# Patient Record
Sex: Female | Born: 1995 | Race: Black or African American | Hispanic: No | Marital: Single | Smoking: Never smoker
Health system: Southern US, Community
[De-identification: ages and names within clinical notes are randomized; demographics above are authoritative.]

---

## 2014-07-25 ENCOUNTER — Emergency Department (HOSPITAL_COMMUNITY)
Admission: EM | Admit: 2014-07-25 | Discharge: 2014-07-26 | Disposition: A | Discharge status: Home / Self Care | Payer: BC Managed Care – PPO | Attending: Emergency Medicine | Admitting: Emergency Medicine

## 2014-07-25 ENCOUNTER — Encounter (HOSPITAL_COMMUNITY): Payer: Self-pay | Admitting: Emergency Medicine

## 2014-07-25 DIAGNOSIS — N6489 Other specified disorders of breast: Secondary | ICD-10-CM | POA: Diagnosis present

## 2014-07-25 DIAGNOSIS — L02213 Cutaneous abscess of chest wall: Secondary | ICD-10-CM

## 2014-07-25 DIAGNOSIS — L02219 Cutaneous abscess of trunk, unspecified: Secondary | ICD-10-CM | POA: Insufficient documentation

## 2014-07-25 DIAGNOSIS — L03319 Cellulitis of trunk, unspecified: Principal | ICD-10-CM

## 2014-07-25 NOTE — ED Notes (Signed)
The pt has a red area that is swollen between both her breasts for a few months.  She has squeezed some pus from this area.  No drainage from the nipple  lmp now

## 2014-07-26 MED ORDER — LIDOCAINE-EPINEPHRINE (PF) 2 %-1:200000 IJ SOLN
10.0000 mL | Freq: Once | INTRAMUSCULAR | Status: AC
  Administered 2014-07-26: 10 mL
  Filled 2014-07-26: qty 20

## 2014-07-26 MED ORDER — TRAMADOL HCL 50 MG PO TABS: 50.0000 mg | ORAL_TABLET | Freq: Four times a day (QID) | ORAL | Status: DC | PRN

## 2014-07-26 MED ORDER — NAPROXEN 500 MG PO TABS: 500.0000 mg | ORAL_TABLET | Freq: Two times a day (BID) | ORAL | Status: DC

## 2014-07-26 NOTE — ED Provider Notes (Signed)
Medical screening examination/treatment/procedure(s) were performed by non-physician practitioner and as supervising physician I was immediately available for consultation/collaboration.   EKG Interpretation None        Lareen Mullings M Elwyn Lowden, MD 07/26/14 0417 

## 2014-07-26 NOTE — Discharge Instructions (Signed)

## 2014-07-26 NOTE — ED Provider Notes (Signed)
CSN: [id number]     Arrival date & time 07/25/14  2351 History   First MD Initiated Contact with Patient 07/26/14 0006     Chief Complaint  Patient presents with  . Breast Problem     (Consider location/radiation/quality/duration/timing/severity/associated sxs/prior Treatment) HPI Pt is a 18yo female presenting to ED with c/o gradually worsening red swollen tender skin to middle of her chest.  Pt states she squeezed the area and some pus did come out. Reports having a similar lesion a few months ago but it went away on its own but now came back.  States pain is constant, 8/10 aching and sore, worse with palpation.  Reports taking ibuprofen with minimal relief. Pt does report hx of boils in her underarm areas at times but has never needed them to be I&D.  Denies any other significant PMH.    History reviewed. No pertinent past medical history. History reviewed. No pertinent past surgical history. No family history on file. History  Substance Use Topics  . Smoking status: Never Smoker   . Smokeless tobacco: Not on file  . Alcohol Use: No   OB History   Grav Para Term Preterm Abortions TAB SAB Ect Mult Living                 Review of Systems  Constitutional: Negative for fever and chills.  Gastrointestinal: Negative for nausea and vomiting.  Musculoskeletal: Negative for arthralgias and myalgias.  Skin: Positive for color change and rash. Negative for wound.  All other systems reviewed and are negative.     Allergies  Review of patient's allergies indicates no known allergies.  Home Medications   Prior to Admission medications   Medication Sig Start Date End Date Taking? Authorizing Provider  naproxen (NAPROSYN) 500 MG tablet Take 1 tablet (500 mg total) by mouth 2 (two) times daily. 07/26/14   Junius Finner, PA-C  traMADol (ULTRAM) 50 MG tablet Take 1 tablet (50 mg total) by mouth every 6 (six) hours as needed. 07/26/14   Junius Finner, PA-C   BP 114/67  Pulse 89   Temp(Src) 98.1 F (36.7 C) (Oral)  Resp 20  Ht  (1.448 m)  Wt 122 lb (55.339 kg)  BMI 26.39 kg/m2  SpO2 97%  LMP 07/25/2014 Physical Exam  Nursing note and vitals reviewed. Constitutional: She is oriented to person, place, and time. She appears well-developed and well-nourished.  HENT:  Head: Normocephalic and atraumatic.  Eyes: EOM are normal.  Neck: Normal range of motion.  Cardiovascular: Normal rate.   Pulmonary/Chest: Effort normal.    Musculoskeletal: Normal range of motion.  Neurological: She is alert and oriented to person, place, and time.  Skin: Skin is warm and dry. There is erythema.  3x4cm oval erythematous lesion to medial aspect of left breast near sternum. Tender to palpation, indurated. No active discharge.  C/w superficial skin abscess.  Psychiatric: She has a normal mood and affect. Her behavior is normal.    ED Course  Procedures  INCISION AND DRAINAGE Performed by: Junius Finner A. Consent: Verbal consent obtained. Risks and benefits: risks, benefits and alternatives were discussed Type: abscess  Body area: left side of sternum/left breast  Anesthesia: local infiltration  Incision was made with a scalpel.  Local anesthetic: lidocaine 2% with epinephrine  Anesthetic total: 1 ml  Complexity: simple  Drainage: purulent  Drainage amount: 0.5cc  Packing material: none Patient tolerance: Patient tolerated the procedure well with no immediate complications.     Labs  Review Labs Reviewed - No data to display  Imaging Review No results found.   EKG Interpretation None      MDM   Final diagnoses:  Cutaneous abscess of chest wall   Pt presenting to ED with cutaneous abscess of left breast, medial aspect.  I&D performed w/o immediate complication. Home care instructions provided. Return precautions provided. Pt verbalized understanding and agreement with tx plan.    Junius Finner, PA-C 07/26/14 [phone]

## 2015-07-23 ENCOUNTER — Emergency Department (HOSPITAL_COMMUNITY)
Admission: EM | Admit: 2015-07-23 | Discharge: 2015-07-23 | Disposition: A | Discharge status: Home / Self Care | Payer: BLUE CROSS/BLUE SHIELD | Attending: Emergency Medicine | Admitting: Emergency Medicine

## 2015-07-23 ENCOUNTER — Encounter (HOSPITAL_COMMUNITY): Payer: Self-pay | Admitting: Family Medicine

## 2015-07-23 DIAGNOSIS — S3992XA Unspecified injury of lower back, initial encounter: Secondary | ICD-10-CM | POA: Insufficient documentation

## 2015-07-23 DIAGNOSIS — S199XXA Unspecified injury of neck, initial encounter: Secondary | ICD-10-CM | POA: Diagnosis not present

## 2015-07-23 DIAGNOSIS — Z791 Long term (current) use of non-steroidal anti-inflammatories (NSAID): Secondary | ICD-10-CM | POA: Diagnosis not present

## 2015-07-23 DIAGNOSIS — O9A211 Injury, poisoning and certain other consequences of external causes complicating pregnancy, first trimester: Secondary | ICD-10-CM | POA: Diagnosis not present

## 2015-07-23 DIAGNOSIS — Y999 Unspecified external cause status: Secondary | ICD-10-CM | POA: Diagnosis not present

## 2015-07-23 DIAGNOSIS — Y9389 Activity, other specified: Secondary | ICD-10-CM | POA: Diagnosis not present

## 2015-07-23 DIAGNOSIS — Y92410 Unspecified street and highway as the place of occurrence of the external cause: Secondary | ICD-10-CM | POA: Insufficient documentation

## 2015-07-23 DIAGNOSIS — S0990XA Unspecified injury of head, initial encounter: Secondary | ICD-10-CM | POA: Diagnosis not present

## 2015-07-23 DIAGNOSIS — Z3A01 Less than 8 weeks gestation of pregnancy: Secondary | ICD-10-CM | POA: Diagnosis not present

## 2015-07-23 DIAGNOSIS — Z3490 Encounter for supervision of normal pregnancy, unspecified, unspecified trimester: Secondary | ICD-10-CM
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
## 2015-07-23 NOTE — ED Notes (Signed)
NAD at this time. Pt is stable and going home.  
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
## 2016-07-24 ENCOUNTER — Ambulatory Visit (HOSPITAL_COMMUNITY)
Admission: EM | Admit: 2016-07-24 | Discharge: 2016-07-24 | Discharge status: Absent without leave | Payer: BLUE CROSS/BLUE SHIELD

## 2017-02-18 ENCOUNTER — Emergency Department (HOSPITAL_COMMUNITY)
Admission: EM | Admit: 2017-02-18 | Discharge: 2017-02-18 | Disposition: A | Discharge status: Home / Self Care | Payer: No Typology Code available for payment source | Attending: Emergency Medicine | Admitting: Emergency Medicine

## 2017-02-18 ENCOUNTER — Encounter (HOSPITAL_COMMUNITY): Payer: Self-pay

## 2017-02-18 ENCOUNTER — Emergency Department (HOSPITAL_COMMUNITY): Payer: No Typology Code available for payment source

## 2017-02-18 DIAGNOSIS — Y999 Unspecified external cause status: Secondary | ICD-10-CM | POA: Diagnosis not present

## 2017-02-18 DIAGNOSIS — Z202 Contact with and (suspected) exposure to infections with a predominantly sexual mode of transmission: Secondary | ICD-10-CM | POA: Diagnosis not present

## 2017-02-18 DIAGNOSIS — Y92410 Unspecified street and highway as the place of occurrence of the external cause: Secondary | ICD-10-CM | POA: Insufficient documentation

## 2017-02-18 DIAGNOSIS — Z113 Encounter for screening for infections with a predominantly sexual mode of transmission: Secondary | ICD-10-CM

## 2017-02-18 DIAGNOSIS — Z79899 Other long term (current) drug therapy: Secondary | ICD-10-CM | POA: Diagnosis not present

## 2017-02-18 DIAGNOSIS — Y939 Activity, unspecified: Secondary | ICD-10-CM | POA: Insufficient documentation

## 2017-02-18 DIAGNOSIS — S39012A Strain of muscle, fascia and tendon of lower back, initial encounter: Secondary | ICD-10-CM | POA: Diagnosis not present

## 2017-02-18 DIAGNOSIS — S3992XA Unspecified injury of lower back, initial encounter: Secondary | ICD-10-CM | POA: Diagnosis present

## 2017-02-18 LAB — CBC WITH DIFFERENTIAL/PLATELET
BASOS ABS: 0 10*3/uL (ref 0.0–0.1)
BASOS PCT: 1 %
EOS ABS: 0.1 10*3/uL (ref 0.0–0.7)
Eosinophils Relative: 2 %
HCT: 38.7 % (ref 36.0–46.0)
HEMOGLOBIN: 12.6 g/dL (ref 12.0–15.0)
Lymphocytes Relative: 39 %
Lymphs Abs: 2.1 10*3/uL (ref 0.7–4.0)
MCH: 29.6 pg (ref 26.0–34.0)
MCHC: 32.6 g/dL (ref 30.0–36.0)
MCV: 90.8 fL (ref 78.0–100.0)
MONOS PCT: 7 %
Monocytes Absolute: 0.4 10*3/uL (ref 0.1–1.0)
NEUTROS ABS: 2.8 10*3/uL (ref 1.7–7.7)
NEUTROS PCT: 51 %
Platelets: 336 10*3/uL (ref 150–400)
RBC: 4.26 MIL/uL (ref 3.87–5.11)
RDW: 14.5 % (ref 11.5–15.5)
WBC: 5.5 10*3/uL (ref 4.0–10.5)

## 2017-02-18 LAB — URINALYSIS, ROUTINE W REFLEX MICROSCOPIC
BILIRUBIN URINE: NEGATIVE
Glucose, UA: NEGATIVE mg/dL
Ketones, ur: NEGATIVE mg/dL
LEUKOCYTES UA: NEGATIVE
Nitrite: POSITIVE — AB
PH: 5 (ref 5.0–8.0)
Protein, ur: NEGATIVE mg/dL
Specific Gravity, Urine: 1.019 (ref 1.005–1.030)

## 2017-02-18 LAB — COMPREHENSIVE METABOLIC PANEL
ALBUMIN: 4.8 g/dL (ref 3.5–5.0)
ALT: 16 U/L (ref 14–54)
AST: 16 U/L (ref 15–41)
Alkaline Phosphatase: 61 U/L (ref 38–126)
Anion gap: 7 (ref 5–15)
BILIRUBIN TOTAL: 0.6 mg/dL (ref 0.3–1.2)
BUN: 11 mg/dL (ref 6–20)
CO2: 24 mmol/L (ref 22–32)
Calcium: 9.7 mg/dL (ref 8.9–10.3)
Chloride: 105 mmol/L (ref 101–111)
Creatinine, Ser: 0.79 mg/dL (ref 0.44–1.00)
GFR calc Af Amer: >60 mL/min (ref >60)
GFR calc non Af Amer: >60 mL/min (ref >60)
GLUCOSE: 94 mg/dL (ref 65–99)
Potassium: 3.6 mmol/L (ref 3.5–5.1)
SODIUM: 136 mmol/L (ref 135–145)
Total Protein: 8.6 g/dL — ABNORMAL HIGH (ref 6.5–8.1)

## 2017-02-18 LAB — I-STAT BETA HCG BLOOD, ED (MC, WL, AP ONLY): I-stat hCG, quantitative: <5 m[IU]/mL (ref <5)

## 2017-02-18 LAB — WET PREP, GENITAL
Sperm: NONE SEEN
TRICH WET PREP: NONE SEEN
YEAST WET PREP: NONE SEEN

## 2017-02-18 MED ORDER — IBUPROFEN 200 MG PO TABS
600.0000 mg | ORAL_TABLET | Freq: Once | ORAL | Status: AC
  Administered 2017-02-18: 600 mg via ORAL
  Filled 2017-02-18: qty 3

## 2017-02-18 MED ORDER — IBUPROFEN 600 MG PO TABS: 600.0000 mg | ORAL_TABLET | Freq: Four times a day (QID) | ORAL | 0 refills | Status: DC | PRN

## 2017-02-18 MED ORDER — METHOCARBAMOL 500 MG PO TABS
500.0000 mg | ORAL_TABLET | Freq: Once | ORAL | Status: AC
  Administered 2017-02-18: 500 mg via ORAL
  Filled 2017-02-18: qty 1

## 2017-02-18 MED ORDER — METHOCARBAMOL 500 MG PO TABS: 500.0000 mg | ORAL_TABLET | Freq: Every evening | ORAL | 0 refills | Status: DC | PRN

## 2017-02-18 MED ORDER — TRAMADOL HCL 50 MG PO TABS
50.0000 mg | ORAL_TABLET | Freq: Once | ORAL | Status: AC
  Administered 2017-02-18: 50 mg via ORAL
  Filled 2017-02-18: qty 1
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
## 2017-02-18 NOTE — Discharge Instructions (Signed)
Take Ibuprofen three times daily for the next week. Take this medicine with food. °Take muscle relaxer at bedtime to help you sleep. This medicine makes you drowsy so do not take before driving or work °Use a heating pad for sore muscles - use for 20 minutes several times a day °Return for worsening symptoms ° °

## 2017-02-19 LAB — RPR: RPR Ser Ql: NONREACTIVE

## 2017-02-19 LAB — HIV ANTIBODY (ROUTINE TESTING W REFLEX): HIV SCREEN 4TH GENERATION: NONREACTIVE

## 2017-02-21 LAB — GC/CHLAMYDIA PROBE AMP (~~LOC~~) NOT AT ARMC
Chlamydia: NEGATIVE
Neisseria Gonorrhea: NEGATIVE

## 2019-12-31 ENCOUNTER — Emergency Department (HOSPITAL_COMMUNITY)
Admission: EM | Admit: 2019-12-31 | Discharge: 2019-12-31 | Disposition: A | Discharge status: Home / Self Care | Payer: Medicaid Other | Attending: Emergency Medicine | Admitting: Emergency Medicine

## 2019-12-31 ENCOUNTER — Encounter (HOSPITAL_COMMUNITY): Payer: Self-pay

## 2019-12-31 ENCOUNTER — Emergency Department (HOSPITAL_COMMUNITY): Payer: Medicaid Other

## 2019-12-31 ENCOUNTER — Other Ambulatory Visit: Payer: Self-pay

## 2019-12-31 DIAGNOSIS — R58 Hemorrhage, not elsewhere classified: Secondary | ICD-10-CM

## 2019-12-31 DIAGNOSIS — B373 Candidiasis of vulva and vagina: Secondary | ICD-10-CM | POA: Diagnosis not present

## 2019-12-31 DIAGNOSIS — B3731 Acute candidiasis of vulva and vagina: Secondary | ICD-10-CM

## 2019-12-31 DIAGNOSIS — B9689 Other specified bacterial agents as the cause of diseases classified elsewhere: Secondary | ICD-10-CM | POA: Diagnosis not present

## 2019-12-31 DIAGNOSIS — N760 Acute vaginitis: Secondary | ICD-10-CM | POA: Insufficient documentation

## 2019-12-31 DIAGNOSIS — N898 Other specified noninflammatory disorders of vagina: Secondary | ICD-10-CM | POA: Diagnosis present

## 2019-12-31 LAB — COMPREHENSIVE METABOLIC PANEL
ALT: 20 U/L (ref 0–44)
AST: 15 U/L (ref 15–41)
Albumin: 4.5 g/dL (ref 3.5–5.0)
Alkaline Phosphatase: 48 U/L (ref 38–126)
Anion gap: 7 (ref 5–15)
BUN: 10 mg/dL (ref 6–20)
CO2: 24 mmol/L (ref 22–32)
Calcium: 9.6 mg/dL (ref 8.9–10.3)
Chloride: 105 mmol/L (ref 98–111)
Creatinine, Ser: 0.68 mg/dL (ref 0.44–1.00)
GFR calc Af Amer: >60 mL/min (ref >60)
GFR calc non Af Amer: >60 mL/min (ref >60)
Glucose, Bld: 101 mg/dL — ABNORMAL HIGH (ref 70–99)
Potassium: 4.2 mmol/L (ref 3.5–5.1)
Sodium: 136 mmol/L (ref 135–145)
Total Bilirubin: 0.6 mg/dL (ref 0.3–1.2)
Total Protein: 8.2 g/dL — ABNORMAL HIGH (ref 6.5–8.1)

## 2019-12-31 LAB — WET PREP, GENITAL
Sperm: NONE SEEN
Trich, Wet Prep: NONE SEEN

## 2019-12-31 LAB — CBC WITH DIFFERENTIAL/PLATELET
Abs Immature Granulocytes: 0.02 10*3/uL (ref 0.00–0.07)
Basophils Absolute: 0 10*3/uL (ref 0.0–0.1)
Basophils Relative: 1 %
Eosinophils Absolute: 0.1 10*3/uL (ref 0.0–0.5)
Eosinophils Relative: 1 %
HCT: 41.3 % (ref 36.0–46.0)
Hemoglobin: 13.2 g/dL (ref 12.0–15.0)
Immature Granulocytes: 0 %
Lymphocytes Relative: 37 %
Lymphs Abs: 2 10*3/uL (ref 0.7–4.0)
MCH: 31.6 pg (ref 26.0–34.0)
MCHC: 32 g/dL (ref 30.0–36.0)
MCV: 98.8 fL (ref 80.0–100.0)
Monocytes Absolute: 0.4 10*3/uL (ref 0.1–1.0)
Monocytes Relative: 7 %
Neutro Abs: 2.9 10*3/uL (ref 1.7–7.7)
Neutrophils Relative %: 54 %
Platelets: 282 10*3/uL (ref 150–400)
RBC: 4.18 MIL/uL (ref 3.87–5.11)
RDW: 14.1 % (ref 11.5–15.5)
WBC: 5.3 10*3/uL (ref 4.0–10.5)
nRBC: 0 % (ref 0.0–0.2)

## 2019-12-31 LAB — URINALYSIS, ROUTINE W REFLEX MICROSCOPIC
Bilirubin Urine: NEGATIVE
Glucose, UA: NEGATIVE mg/dL
Hgb urine dipstick: NEGATIVE
Ketones, ur: NEGATIVE mg/dL
Leukocytes,Ua: NEGATIVE
Nitrite: NEGATIVE
Protein, ur: NEGATIVE mg/dL
Specific Gravity, Urine: 1.014 (ref 1.005–1.030)
pH: 8 (ref 5.0–8.0)

## 2019-12-31 LAB — I-STAT BETA HCG BLOOD, ED (MC, WL, AP ONLY): I-stat hCG, quantitative: <5 m[IU]/mL (ref <5)

## 2019-12-31 LAB — HIV ANTIBODY (ROUTINE TESTING W REFLEX): HIV Screen 4th Generation wRfx: NONREACTIVE

## 2019-12-31 MED ORDER — METRONIDAZOLE 500 MG PO TABS: 500.0000 mg | ORAL_TABLET | Freq: Two times a day (BID) | ORAL | 0 refills | Status: AC

## 2019-12-31 MED ORDER — FLUCONAZOLE 150 MG PO TABS: 150.0000 mg | ORAL_TABLET | Freq: Every day | ORAL | 0 refills | Status: AC

## 2019-12-31 MED ORDER — FLUCONAZOLE 150 MG PO TABS
150.0000 mg | ORAL_TABLET | Freq: Once | ORAL | Status: AC
  Administered 2019-12-31: 150 mg via ORAL
  Filled 2019-12-31: qty 1

## 2019-12-31 NOTE — Discharge Instructions (Addendum)
You have been seen today for dental pain and vaginal bleeding.. Please read and follow all provided instructions. Return to the emergency room for worsening condition or new concerning symptoms.    The swab today showed you have a yeast infection and possibly bacterial vaginosis.  1. Medications:  Prescription to your pharmacy for metronidazole.  This is used to treat bacterial vaginosis.  Please take as prescribed.  Do not drink alcohol taking this medication as it will make you very sick and vomit. -Prescription also sent for fluconazole.  This is used to treat yeast infections.  Please take the pill in 3 days.  You are given your first dose here. Continue usual home medications Take medications as prescribed. Please review all of the medicines and only take them if you do not have an allergy to them.   2. Treatment: rest, drink plenty of fluids  3. Follow Up:  Please follow up with gynecologist discussed the placement of her IUD.  It looks to be too low.  Call the office tomorrow to schedule the next available follow-up appointment.   It is also a possibility that you have an allergic reaction to any of the medicines that you have been prescribed - Everybody reacts differently to medications and while MOST people have no trouble with most medicines, you may have a reaction such as nausea, vomiting, rash, swelling, shortness of breath. If this is the case, please stop taking the medicine immediately and contact your physician.  ?

## 2019-12-31 NOTE — ED Triage Notes (Signed)
24 yo female lower abd cramps since Saturday. Denies n/v/d. Pt passed blood/discharge this morning but unsure if from bladder or vagina. No fevers/chills. S/B Urgent care today and sent here for [place]. IUD placed 2017.  LMP 12/08/19

## 2019-12-31 NOTE — ED Provider Notes (Signed)
[facility]-EMERGENCY DEPT Provider Note   CSN: [id number] Arrival date & time: 12/31/19  1019     History Chief Complaint  Patient presents with  . Abdominal Pain  . Vaginal Bleeding    Donna Potts is a 24 y.o. female G1P1001 presents to emergency department today with chief complaint of intermittent abdominal pain and vaginal bleeding x 2 days.  Patient states on Saturday she had lower abdominal cramping and when she used the bathroom she passed what looked to be a white and red clot.  She states this happened a second time the next day.  She states the cramping was very mild and only lasted a few minutes.  She went to urgent care and was sent here for further evaluation.  No medications were used prior to arrival.  She is sexually active with one female partner without protection.  Patient states she also had STD testing last week that was negative for gonorrhea and chlamydia. Denies any fever, chills, chest pain, shortness of breath, pelvic pain, vaginal discharge, flank pain, back pain, urinary frequency, dysuria, gross hematuria.  Patient had Mirena IUD that was placed in 2017.  She states she has regular periods, LMP was 12/08/2019.  History reviewed. No pertinent past medical history.  There are no problems to display for this patient.   Past Surgical History:  Procedure Laterality Date  . CESAREAN SECTION       OB History    Gravida  2   Para  1   Term      Preterm      AB  1   Living        SAB      TAB      Ectopic      Multiple      Live Births              History reviewed. No pertinent family history.  Social History   Tobacco Use  . Smoking status: Never Smoker  . Smokeless tobacco: Never Used  Substance Use Topics  . Alcohol use: No  . Drug use: No    Home Medications Prior to Admission medications   Medication Sig Start Date End Date Taking? Authorizing Provider  metroNIDAZOLE (METROGEL) 0.75 % vaginal gel  Place 1 Applicatorful vaginally at bedtime. 12/26/19  Yes [provider]  fluconazole (DIFLUCAN) 150 MG tablet Take 1 tablet (150 mg total) by mouth daily. Take in 3 days 12/31/19   Josealfredo Adkins, Yvonna Alanis E, PA-C  metroNIDAZOLE (FLAGYL) 500 MG tablet Take 1 tablet (500 mg total) by mouth 2 (two) times daily. 12/31/19   Bradden Tadros E, PA-C    Allergies    Amoxicillin-pot clavulanate and Penicillins  Review of Systems   Review of Systems All other systems are reviewed and are negative for acute change except as noted in the HPI.  Physical Exam Updated Vital Signs BP 113/72 (BP Location: Left Arm)   Pulse 78   Temp 98 F (36.7 C) (Oral)   Resp 16   Ht 4\' 10"  (1.473 m)   Wt 53.5 kg   LMP 12/08/2019 (Exact Date)   SpO2 98%   BMI 24.66 kg/m   Physical Exam Vitals and nursing note reviewed.  Constitutional:      General: She is not in acute distress.    Appearance: She is not ill-appearing.  HENT:     Head: Normocephalic and atraumatic.     Right Ear: Tympanic membrane and external ear normal.  Left Ear: Tympanic membrane and external ear normal.     Nose: Nose normal.     Mouth/Throat:     Mouth: Mucous membranes are moist.     Pharynx: Oropharynx is clear.  Eyes:     General: No scleral icterus.       Right eye: No discharge.        Left eye: No discharge.     Extraocular Movements: Extraocular movements intact.     Conjunctiva/sclera: Conjunctivae normal.     Pupils: Pupils are equal, round, and reactive to light.  Neck:     Vascular: No JVD.  Cardiovascular:     Rate and Rhythm: Normal rate and regular rhythm.     Pulses: Normal pulses.          Radial pulses are 2+ on the right side and 2+ on the left side.     Heart sounds: Normal heart sounds.  Pulmonary:     Comments: Lungs clear to auscultation in all fields. Symmetric chest rise. No wheezing, rales, or rhonchi. Abdominal:     Comments: Abdomen is soft, non-distended, mild tenderness palpation of  bilateral lower quadrants.  No rigidity, no guarding. No peritoneal signs.  Normoactive bowel sounds.  Genitourinary:    Comments: Normal external genitalia. No pain with speculum insertion. Closed cervical os with normal appearance - no rash or lesions. No bleeding noted from cervix or in vaginal vault.  Thick white discharge in vaginal vault. On bimanual examination no adnexal tenderness or cervical motion tenderness. Chaperone Galaxi present during exam.  Musculoskeletal:        General: Normal range of motion.     Cervical back: Normal range of motion.  Skin:    General: Skin is warm and dry.     Capillary Refill: Capillary refill takes less than 2 seconds.  Neurological:     Mental Status: She is oriented to person, place, and time.     GCS: GCS eye subscore is 4. GCS verbal subscore is 5. GCS motor subscore is 6.     Comments: Fluent speech, no facial droop.  Psychiatric:        Behavior: Behavior normal.     ED Results / Procedures / Treatments   Labs (all labs ordered are listed, but only abnormal results are displayed) Labs Reviewed  WET PREP, GENITAL - Abnormal; Notable for the following components:      Result Value   Yeast Wet Prep HPF POC PRESENT (*)    Clue Cells Wet Prep HPF POC PRESENT (*)    WBC, Wet Prep HPF POC FEW (*)    All other components within normal limits  COMPREHENSIVE METABOLIC PANEL - Abnormal; Notable for the following components:   Glucose, Bld 101 (*)    Total Protein 8.2 (*)    All other components within normal limits  CBC WITH DIFFERENTIAL/PLATELET  URINALYSIS, ROUTINE W REFLEX MICROSCOPIC  HIV ANTIBODY (ROUTINE TESTING W REFLEX)  RPR  I-STAT BETA HCG BLOOD, ED ([facility], [facility], [facility] ONLY)    EKG None  Radiology US PELVIC COMPLETE W TRANSVAGINAL AND TORSION R/O  Result Date: 12/31/2019 CLINICAL DATA:  Vaginal bleeding, passed clot, abdominal cramping, IUD EXAM: TRANSABDOMINAL AND TRANSVAGINAL ULTRASOUND OF PELVIS DOPPLER ULTRASOUND OF OVARIES  TECHNIQUE: Both transabdominal and transvaginal ultrasound examinations of the pelvis were performed. Transabdominal technique was performed for global imaging of the pelvis including uterus, ovaries, adnexal regions, and pelvic cul-de-sac. It was necessary to proceed with endovaginal exam following the transabdominal  exam to visualize the endometrium and IUD. Color and duplex Doppler ultrasound was utilized to evaluate blood flow to the ovaries. COMPARISON:  None FINDINGS: Uterus Measurements: 8.2 x 4.3 x 5.3 cm = volume: 97 mL. Heterogeneous myometrium. No focal mass. Endometrium Thickness: 2 mm. Trace endometrial fluid at upper uterine segment. IUD identified within lower uterine segment endometrial canal extending into cervix. Right ovary Measurements: 3.0 x 1.9 x 2.7 cm = volume: 7.9 mL. Normal morphology without mass Left ovary Measurements: 2.0 x 2.5 x 1.5 cm = volume: 4 mL. Normal morphology without mass Pulsed Doppler evaluation of both ovaries demonstrates low resistance arterial and venous waveforms within both ovaries Other findings No free pelvic fluid or adnexal masses. IMPRESSION: IUD identified in abnormal position within the endometrial canal at the lower uterine segment extending into the endocervical canal. No other significant pelvic sonographic abnormalities. Electronically Signed   By: Ulyses Southward M.D.   On: 12/31/2019 15:23    Procedures Procedures (including critical care time)  Medications Ordered in ED Medications  fluconazole (DIFLUCAN) tablet 150 mg (has no administration in time range)    ED Course  I have reviewed the triage vital signs and the nursing notes.  Pertinent labs & imaging results that were available during my care of the patient were reviewed by me and considered in my medical decision making (see chart for details).    MDM Rules/Calculators/A&P                      Patient seen and examined. Patient presents awake, alert, hemodynamically stable,  afebrile, non toxic.  She is very well appearing.  My exam she has mild tenderness palpation of bilateral lower quadrants that she describes more as a pressure feeling.  Will check basic labs, pregnancy test, exam, ultrasound.  Patient declines gonorrhea and Chlamydia testing as she recently had it last week negative result.  She does consent to HIV and syphilis testing.  CBC without leukocytosis, no anemia. CMP without severe electrolyte derangement, no renal insufficiency, normal liver enzymes. UA negative for infection. Pregnancy test is negative. Wet Prep shows yeast and clue cells.  Pelvic exam performed and there was thick white discharge in the vaginal vault.  No bleeding seen.  Will treat for yeast and bacterial vaginosis.  No cervical or adnexal tenderness on exam, exam not suggestive of PID.  Patient been hemodynamically stable here in the emergency department. Pelvic ultrasound is negative for torsion.  Ovaries look normal.  Radiologist does comment on the placement of the IUD being low.  Discussed this with patient and she will follow up with gynecologist to discuss this further.  The patient appears reasonably screened and/or stabilized for discharge and I doubt any other medical condition or other [facility] requiring further screening, evaluation, or treatment in the ED at this time prior to discharge. The patient is safe for discharge with strict return precautions discussed. Recommend gyn follow up.   Portions of this note were generated with Scientist, clinical (histocompatibility and immunogenetics). Dictation errors may occur despite best attempts at proofreading.   Final Clinical Impression(s) / ED Diagnoses Final diagnoses:  Bleeding  BV (bacterial vaginosis)  Yeast infection of the vagina    Rx / DC Orders ED Discharge Orders         Ordered    fluconazole (DIFLUCAN) 150 MG tablet  Daily     12/31/19 1557    metroNIDAZOLE (FLAGYL) 500 MG tablet  2 times daily  12/31/19 1557           Ryelee Albee, Caroleen Hamman, PA-C 12/31/19 1559    Pollyann Savoy, MD 12/31/19 [phone]

## 2020-01-01 LAB — RPR: RPR Ser Ql: NONREACTIVE

## 2020-07-08 ENCOUNTER — Emergency Department (HOSPITAL_COMMUNITY): Payer: Medicaid Other

## 2020-07-08 ENCOUNTER — Other Ambulatory Visit: Payer: Self-pay

## 2020-07-08 ENCOUNTER — Emergency Department (HOSPITAL_COMMUNITY)
Admission: EM | Admit: 2020-07-08 | Discharge: 2020-07-08 | Disposition: A | Discharge status: Home / Self Care | Payer: Medicaid Other | Attending: Emergency Medicine | Admitting: Emergency Medicine

## 2020-07-08 ENCOUNTER — Encounter (HOSPITAL_COMMUNITY): Payer: Self-pay

## 2020-07-08 DIAGNOSIS — R079 Chest pain, unspecified: Secondary | ICD-10-CM | POA: Insufficient documentation

## 2020-07-08 DIAGNOSIS — U071 COVID-19: Secondary | ICD-10-CM | POA: Insufficient documentation

## 2020-07-08 LAB — I-STAT BETA HCG BLOOD, ED (MC, WL, AP ONLY): I-stat hCG, quantitative: <5 m[IU]/mL (ref <5)

## 2020-07-08 MED ORDER — IOHEXOL 350 MG/ML SOLN
100.0000 mL | Freq: Once | INTRAVENOUS | Status: AC | PRN
  Administered 2020-07-08: 100 mL via INTRAVENOUS

## 2020-07-08 NOTE — ED Provider Notes (Signed)
[facility]-EMERGENCY DEPT Provider Note   CSN: [id number] Arrival date & time: 07/08/20  1237     History Chief Complaint  Patient presents with  . COVID SX    Donna Potts is a 24 y.o. female.  HPI Patient reportedly sent in by urgent care for pulmonary embolism work-up.  On August 20 patient was diagnosed with Covid.  Had been feeling somewhat better after around a week or 2.  Then after about 3 good days began to feel worse again.  Had cough.  Did develop another fever.  Reported went to urgent care and negative Covid test at that time.  Had what sounds like an x-ray that she was told was negative.  On Friday was told to come into the ER to work-up pulmonary embolism because she has been coughing up sputum.  No swelling in her legs.  Does have some pleuritic pain at times.  Does feel a little more short of breath.    History reviewed. No pertinent past medical history.  There are no problems to display for this patient.   Past Surgical History:  Procedure Laterality Date  . CESAREAN SECTION       OB History    Gravida  2   Para  1   Term      Preterm      AB  1   Living        SAB      TAB      Ectopic      Multiple      Live Births              History reviewed. No pertinent family history.  Social History   Tobacco Use  . Smoking status: Never Smoker  . Smokeless tobacco: Never Used  Substance Use Topics  . Alcohol use: No  . Drug use: No    Home Medications Prior to Admission medications   Medication Sig Start Date End Date Taking? Authorizing Provider  fluconazole (DIFLUCAN) 150 MG tablet Take 1 tablet (150 mg total) by mouth daily. Take in 3 days 12/31/19   Albrizze, Yvonna Alanis E, PA-C  metroNIDAZOLE (FLAGYL) 500 MG tablet Take 1 tablet (500 mg total) by mouth 2 (two) times daily. 12/31/19   Albrizze, Kaitlyn E, PA-C  metroNIDAZOLE (METROGEL) 0.75 % vaginal gel Place 1 Applicatorful vaginally at bedtime. 12/26/19    [provider]    Allergies    Amoxicillin-pot clavulanate and Penicillins  Review of Systems   Review of Systems  Constitutional: Positive for fatigue and fever. Negative for appetite change.  HENT: Negative for congestion.   Respiratory: Positive for cough and shortness of breath.   Cardiovascular: Positive for chest pain. Negative for leg swelling.  Genitourinary: Negative for flank pain.  Musculoskeletal: Negative for back pain.  Skin: Negative for rash.  Neurological: Negative for weakness.  Psychiatric/Behavioral: Negative for confusion.    Physical Exam Updated Vital Signs BP 115/80   Pulse 87   Temp 98 F (36.7 C) (Oral)   Resp 18   SpO2 99%   Physical Exam Vitals and nursing note reviewed.  HENT:     Head: Normocephalic.  Eyes:     Pupils: Pupils are equal, round, and reactive to light.  Cardiovascular:     Rate and Rhythm: Regular rhythm.  Pulmonary:     Breath sounds: No wheezing, rhonchi or rales.  Abdominal:     Tenderness: There is no abdominal tenderness.  Musculoskeletal:  Cervical back: Neck supple.     Right lower leg: No edema.     Left lower leg: No edema.  Skin:    General: Skin is warm.     Capillary Refill: Capillary refill takes less than 2 seconds.  Neurological:     Mental Status: She is alert and oriented to person, place, and time.     ED Results / Procedures / Treatments   Labs (all labs ordered are listed, but only abnormal results are displayed) Labs Reviewed  I-STAT BETA HCG BLOOD, ED ([facility], [facility], [facility] ONLY)    EKG None  Radiology CT Angio Chest PE W and/or Wo Contrast  Result Date: 07/08/2020 CLINICAL DATA:  COVID positive. PE suspected. Cough, shortness of breath EXAM: CT ANGIOGRAPHY CHEST WITH CONTRAST TECHNIQUE: Multidetector CT imaging of the chest was performed using the standard protocol during bolus administration of intravenous contrast. Multiplanar CT image reconstructions and MIPs were obtained to  evaluate the vascular anatomy. CONTRAST:  OMNIPAQUE IOHEXOL 350 MG/ML SOLN COMPARISON:  None. FINDINGS: Cardiovascular: No filling defects in the pulmonary arteries to suggest pulmonary emboli. Heart is normal size. Aorta is normal caliber. Mediastinum/Nodes: No mediastinal, hilar, or axillary adenopathy. Trachea and esophagus are unremarkable. Thyroid unremarkable. Lungs/Pleura: Lungs are clear. No focal airspace opacities or suspicious nodules. No effusions. Upper Abdomen: Imaging into the upper abdomen demonstrates no acute findings. Musculoskeletal: Chest wall soft tissues are unremarkable. No acute bony abnormality. Review of the MIP images confirms the above findings. IMPRESSION: No evidence of pulmonary embolus. No acute cardiopulmonary disease. Electronically Signed   By: Charlett Nose M.D.   On: 07/08/2020 19:29    Procedures Procedures (including critical care time)  Medications Ordered in ED Medications  iohexol (OMNIPAQUE) 350 MG/ML injection 100 mL (100 mLs Intravenous Contrast Given 07/08/20 1917)    ED Course  I have reviewed the triage vital signs and the nursing notes.  Pertinent labs & imaging results that were available during my care of the patient were reviewed by me and considered in my medical decision making (see chart for details).    MDM Rules/Calculators/A&P                          Patient with COVID-19.  Worsening shortness of breath the last few days.  Reportedly seen at urgent care and sent in to rule out pulmonary embolism.  This is reasonable since symptoms have been better than worsened.  Also somewhat pleuritic pain.  CT scan done and does not show pulmonary embolism.  Also no pneumonia or pulmonary changes.  Will discharge home.  Patient is well-appearing.  This is likely just remnants of her Covid infection. Final Clinical Impression(s) / ED Diagnoses Final diagnoses:  COVID-19    Rx / DC Orders ED Discharge Orders    None       Benjiman Core, MD 07/08/20 1942

## 2020-07-08 NOTE — ED Triage Notes (Addendum)
Pt seen at [facility] last Friday. Patient covid positive 8/20. Patient negative for covid this past Friday at urgent care.    Pt was told to come to ED Friday and check for blood clots but never did.  Patient reports she had some type of scan but does not know if it was xray or CT.  Pt reports "some" shob when getting up "but not really"  C/o coughing  All symptoms started 2 weeks ago.

## 2020-07-08 NOTE — ED Notes (Signed)
An After Visit Summary was printed and given to the patient. Discharge instructions given and no further questions at this time.  

## 2022-02-21 IMAGING — CT CT ANGIO CHEST
2 of 6 series · 18 of 36 positions shown · IV contrast (omnipaque)
Comparison: None.

CLINICAL DATA: COVID positive. PE suspected. Cough, shortness of
breath

EXAM:
CT ANGIOGRAPHY CHEST WITH CONTRAST
TECHNIQUE: Multidetector CT imaging of the chest was performed using the
standard protocol during bolus administration of intravenous
contrast. Multiplanar CT image reconstructions and MIPs were
obtained to evaluate the vascular anatomy.
CONTRAST:  100mL OMNIPAQUE IOHEXOL 350 MG/ML SOLN

[Series 7: thins · axial · 0.62mm/px · z∈[-308,-100]mm · 17 of 233 slices shown]
[im 13/233  lung]
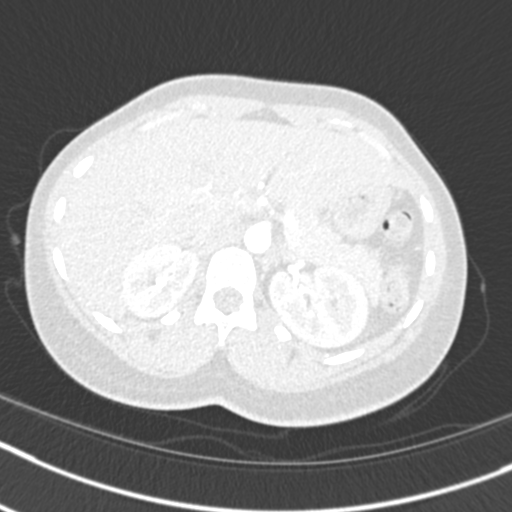
[im 26/233  mediastinal]
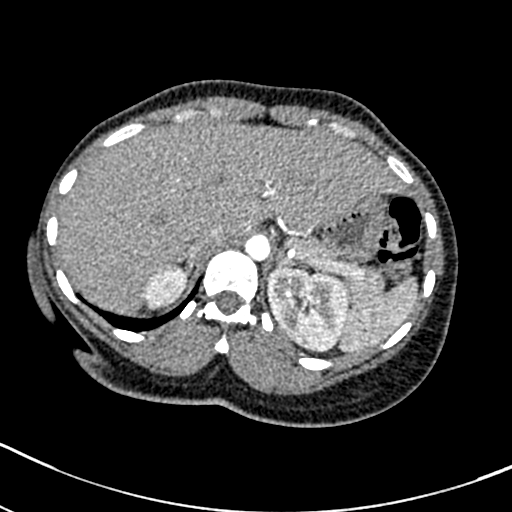
[im 39/233  lung]
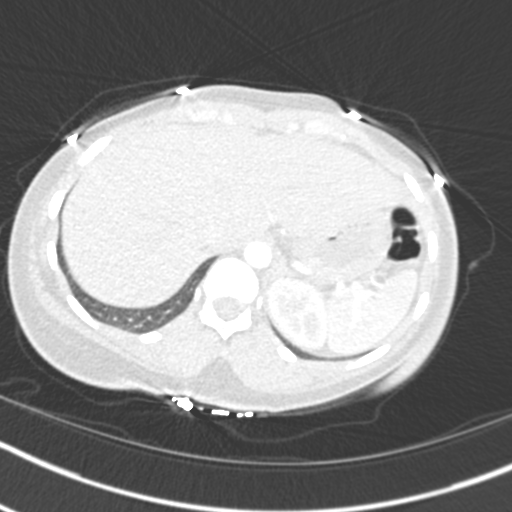
[im 52/233  mediastinal]
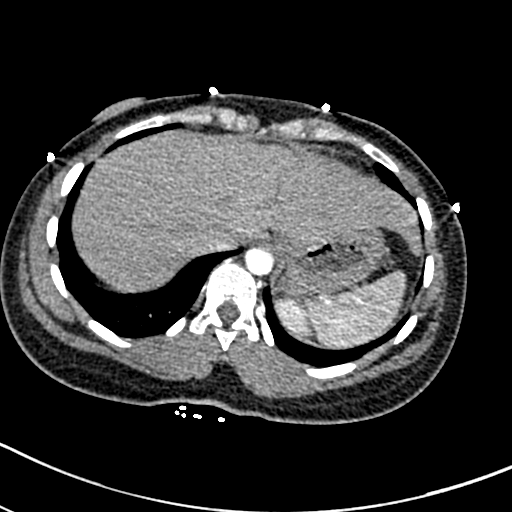
[im 65/233  lung]
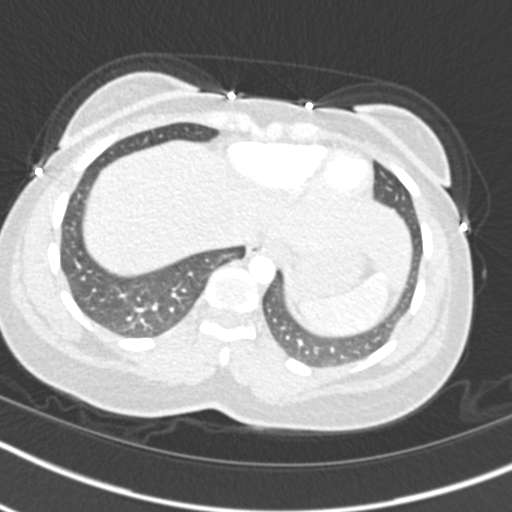
[im 78/233  mediastinal]
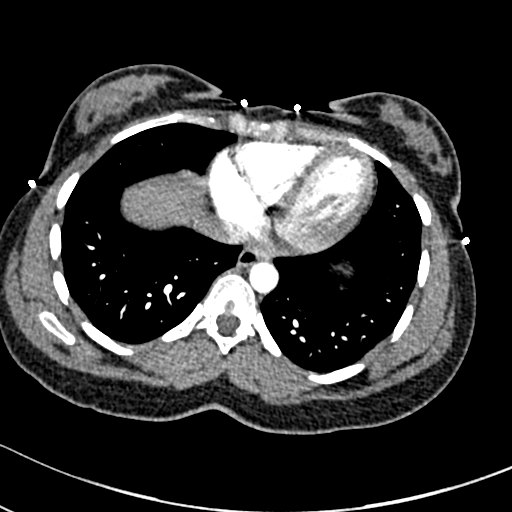
[im 91/233  lung]
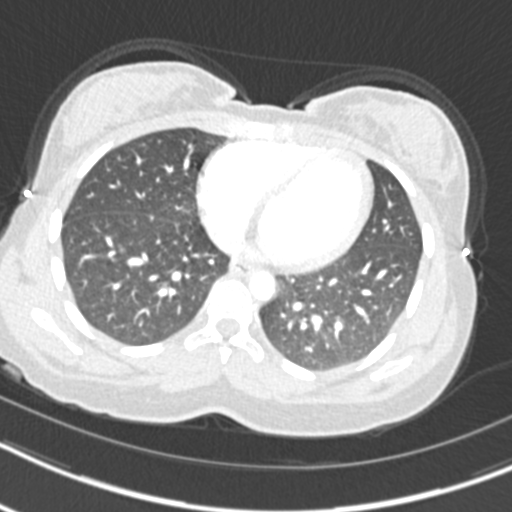
[im 104/233  mediastinal]
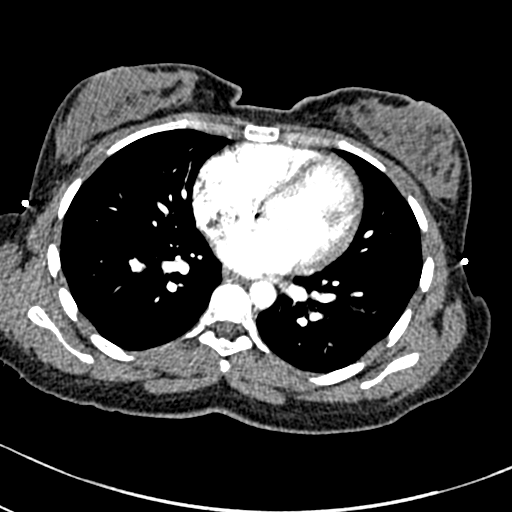
[im 117/233  lung]
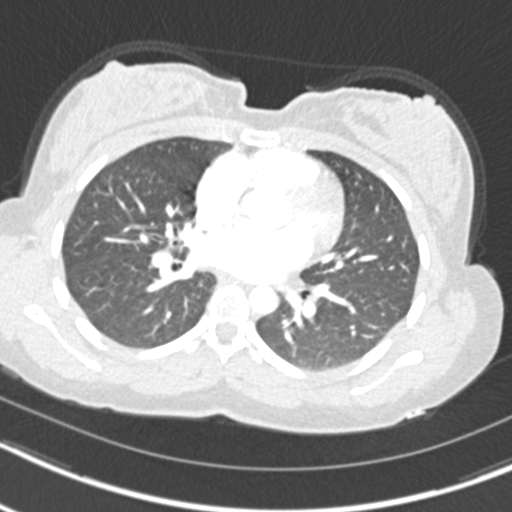
[im 129/233  mediastinal]
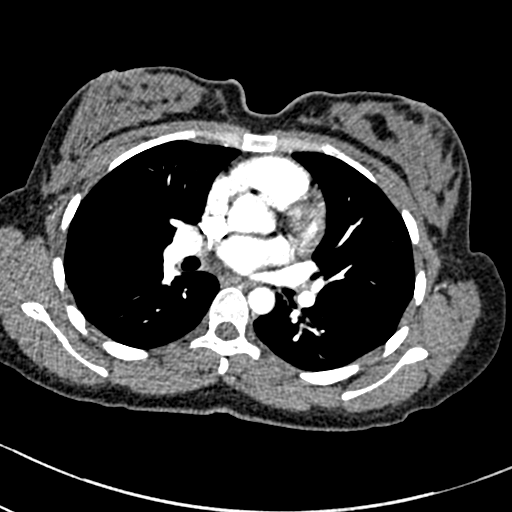
[im 142/233  lung]
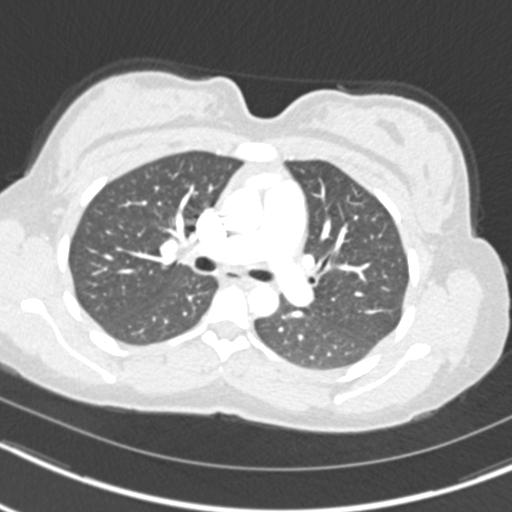
[im 155/233  mediastinal]
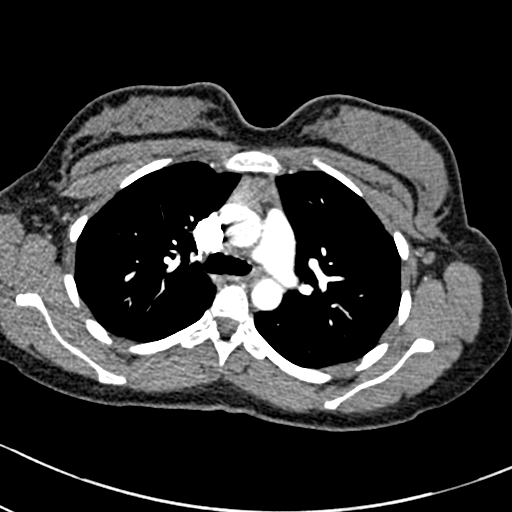
[im 168/233  lung]
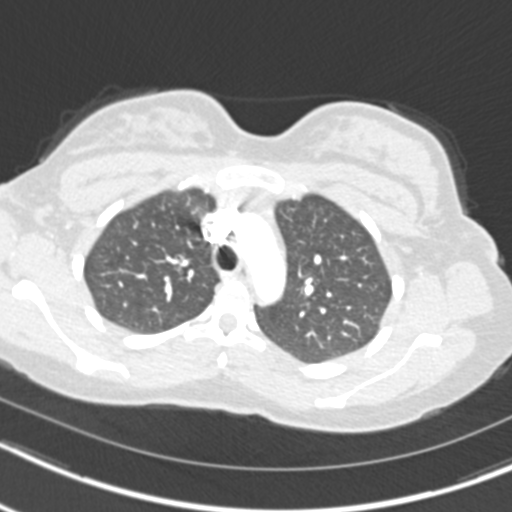
[im 181/233  mediastinal]
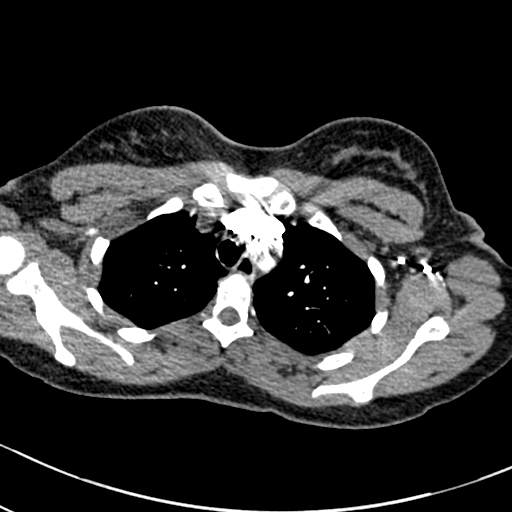
[im 194/233  lung]
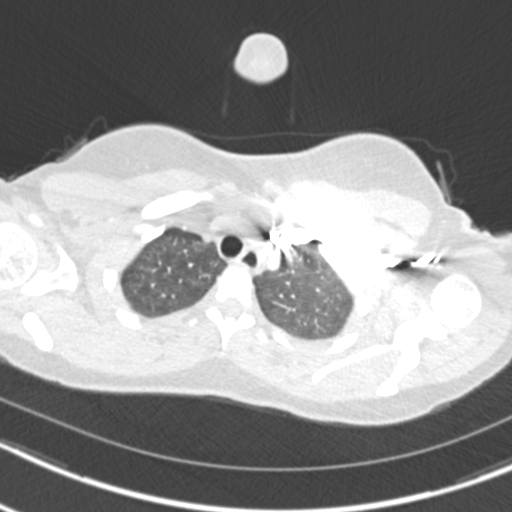
[im 207/233  mediastinal]
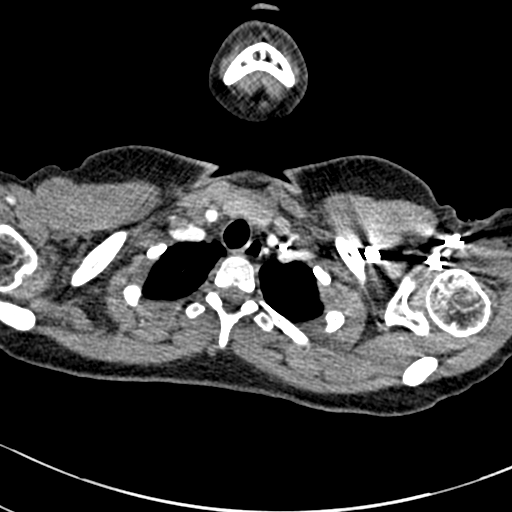
[im 220/233  lung]
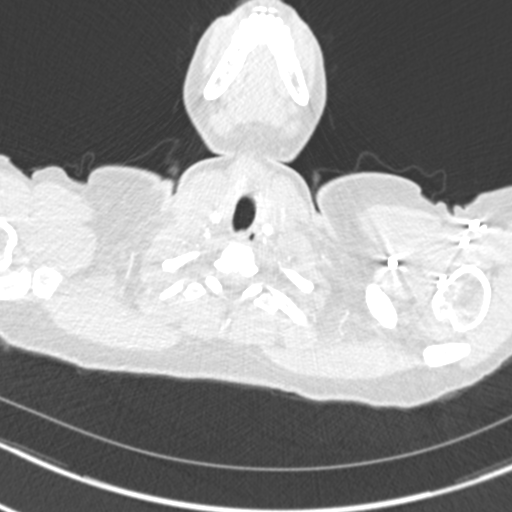

[Series 9: coronal mpr · coronal · 0.46mm/px · 1 of 111 slices shown]
[im 56/111  mediastinal]
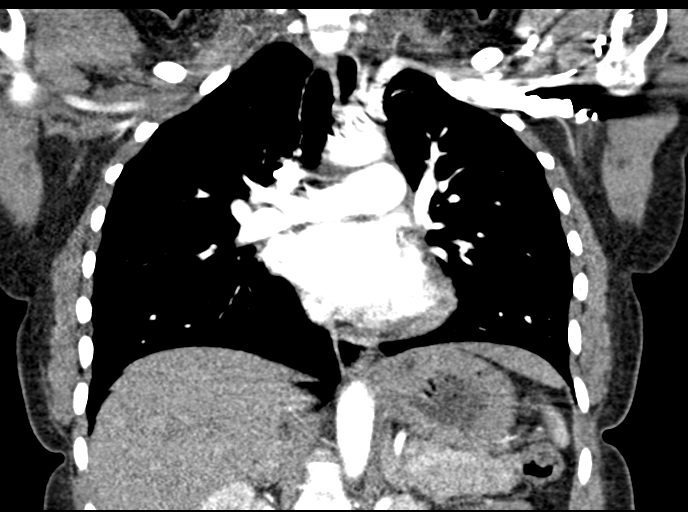

[18 of 36 positions shown; findings below may reference images not displayed]

FINDINGS: Cardiovascular: No filling defects in the pulmonary arteries to
suggest pulmonary emboli. Heart is normal size. Aorta is normal
caliber.

Mediastinum/Nodes: No mediastinal, hilar, or axillary adenopathy.
Trachea and esophagus are unremarkable. Thyroid unremarkable.

Lungs/Pleura: Lungs are clear. No focal airspace opacities or
suspicious nodules. No effusions.

Upper Abdomen: Imaging into the upper abdomen demonstrates no acute
findings.

Musculoskeletal: Chest wall soft tissues are unremarkable. No acute
bony abnormality.

Review of the MIP images confirms the above findings.
IMPRESSION: No evidence of pulmonary embolus.

No acute cardiopulmonary disease.

## 2023-06-16 ENCOUNTER — Emergency Department (HOSPITAL_BASED_OUTPATIENT_CLINIC_OR_DEPARTMENT_OTHER): Payer: Medicaid Other

## 2023-06-16 ENCOUNTER — Emergency Department (HOSPITAL_BASED_OUTPATIENT_CLINIC_OR_DEPARTMENT_OTHER)
Admission: EM | Admit: 2023-06-16 | Discharge: 2023-06-16 | Disposition: A | Discharge status: Home / Self Care | Payer: Medicaid Other | Attending: Emergency Medicine | Admitting: Emergency Medicine

## 2023-06-16 ENCOUNTER — Ambulatory Visit: Payer: Self-pay | Admitting: *Deleted

## 2023-06-16 ENCOUNTER — Encounter (HOSPITAL_BASED_OUTPATIENT_CLINIC_OR_DEPARTMENT_OTHER): Payer: Self-pay | Admitting: Emergency Medicine

## 2023-06-16 DIAGNOSIS — Z3A00 Weeks of gestation of pregnancy not specified: Secondary | ICD-10-CM | POA: Insufficient documentation

## 2023-06-16 DIAGNOSIS — O039 Complete or unspecified spontaneous abortion without complication: Secondary | ICD-10-CM | POA: Diagnosis not present

## 2023-06-16 DIAGNOSIS — N939 Abnormal uterine and vaginal bleeding, unspecified: Secondary | ICD-10-CM

## 2023-06-16 DIAGNOSIS — O209 Hemorrhage in early pregnancy, unspecified: Secondary | ICD-10-CM | POA: Diagnosis present

## 2023-06-16 LAB — CBC WITH DIFFERENTIAL/PLATELET
Abs Immature Granulocytes: 0.02 10*3/uL (ref 0.00–0.07)
Basophils Absolute: 0 10*3/uL (ref 0.0–0.1)
Basophils Relative: 1 %
Eosinophils Absolute: 0.2 10*3/uL (ref 0.0–0.5)
Eosinophils Relative: 3 %
HCT: 37.1 % (ref 36.0–46.0)
Hemoglobin: 12 g/dL (ref 12.0–15.0)
Immature Granulocytes: 0 %
Lymphocytes Relative: 34 %
Lymphs Abs: 2.4 10*3/uL (ref 0.7–4.0)
MCH: 29.9 pg (ref 26.0–34.0)
MCHC: 32.3 g/dL (ref 30.0–36.0)
MCV: 92.5 fL (ref 80.0–100.0)
Monocytes Absolute: 0.7 10*3/uL (ref 0.1–1.0)
Monocytes Relative: 10 %
Neutro Abs: 3.7 10*3/uL (ref 1.7–7.7)
Neutrophils Relative %: 52 %
Platelets: 356 10*3/uL (ref 150–400)
RBC: 4.01 MIL/uL (ref 3.87–5.11)
RDW: 13.5 % (ref 11.5–15.5)
WBC: 7.1 10*3/uL (ref 4.0–10.5)
nRBC: 0 % (ref 0.0–0.2)

## 2023-06-16 LAB — URINALYSIS, ROUTINE W REFLEX MICROSCOPIC

## 2023-06-16 LAB — COMPREHENSIVE METABOLIC PANEL
ALT: 17 U/L (ref 0–44)
AST: 16 U/L (ref 15–41)
Albumin: 3.9 g/dL (ref 3.5–5.0)
Alkaline Phosphatase: 60 U/L (ref 38–126)
Anion gap: 10 (ref 5–15)
BUN: 11 mg/dL (ref 6–20)
CO2: 24 mmol/L (ref 22–32)
Calcium: 9.2 mg/dL (ref 8.9–10.3)
Chloride: 102 mmol/L (ref 98–111)
Creatinine, Ser: 0.76 mg/dL (ref 0.44–1.00)
GFR, Estimated: >60 mL/min (ref >60)
Glucose, Bld: 93 mg/dL (ref 70–99)
Potassium: 4 mmol/L (ref 3.5–5.1)
Sodium: 136 mmol/L (ref 135–145)
Total Bilirubin: 0.3 mg/dL (ref 0.3–1.2)
Total Protein: 7.8 g/dL (ref 6.5–8.1)

## 2023-06-16 LAB — ABO/RH: ABO/RH(D): B POS

## 2023-06-16 LAB — URINALYSIS, MICROSCOPIC (REFLEX): RBC / HPF: >50 RBC/hpf (ref 0–5)

## 2023-06-16 LAB — HCG, QUANTITATIVE, PREGNANCY: hCG, Beta Chain, Quant, S: 49 m[IU]/mL — ABNORMAL HIGH (ref <5)

## 2023-06-16 LAB — PREGNANCY, URINE: Preg Test, Ur: POSITIVE — AB

## 2023-06-16 MED ORDER — MEDROXYPROGESTERONE ACETATE 10 MG PO TABS: 20.0000 mg | ORAL_TABLET | Freq: Three times a day (TID) | ORAL | 0 refills | Status: AC

## 2023-06-16 NOTE — ED Triage Notes (Signed)
Pt reports vaginal bleeding since yesterday; +preg test 1 wk ago; c/o pelvic cramping

## 2023-06-16 NOTE — Discharge Instructions (Addendum)
You were seen emergency department today for vaginal bleeding.  As we discussed your ultrasound did not show an intrauterine pregnancy.  This is consistent with a miscarriage.  I prescribed a medicine that you can take to help stop the bleeding.  This should have improvement in 3 days or so.  Please give your OB/GYN a call first thing tomorrow morning, and keep a close eye on how much blood you are losing.  Continue to monitor how you are doing and return to the ER for new or worsening symptoms such as significant pain, increased blood loss, lightheadedness or feeling like he might pass out.

## 2023-06-16 NOTE — Telephone Encounter (Signed)
  Chief Complaint: vaginal bleeding s/p pregnancy test last week positive 06/09/23 Symptoms: low abdominal pain, constant with sharp pains comes and goes. Cramping today. Vaginal bleeding since 06/13/23 now changing pad every 2 hours with bigger clots quarter size thick burgundy in color. Frequency: 06/13/23 Pertinent Negatives: Patient denies dizziness.  Disposition: [x] ED /[] Urgent Care (no appt availability in office) / [] Appointment(In office/virtual)/ []  [facility]/ [] Home Care/ [] Refused Recommended Disposition /[] [facility] [place] [facility]/ []  Follow-up with PCP Additional Notes:   Recommended go to ED now.     Reason for Disposition  [1] MODERATE vaginal bleeding (e.g., soaking 1 pad per hour; clots) AND [2] present > 6 hours  Answer Assessment - Initial Assessment Questions 1. ONSET: "When did this bleeding start?"       06/13/23 2. DESCRIPTION: "Describe the bleeding that you are having." "How much bleeding is there?"    - SPOTTING: spotting, or pinkish / brownish mucous discharge; does not fill panty liner or pad    - MILD:  less than 1 pad / hour; less than patient's usual menstrual bleeding   - MODERATE: 1-2 pads / hour; 1 menstrual cup every 6 hours; small-medium blood clots (e.g., pea, grape, small coin)   - SEVERE: soaking 2 or more pads/hour for 2 or more hours; 1 menstrual cup every 2 hours; bleeding not contained by pads or continuous red blood from vagina; large blood clots (e.g., golf ball, large coin)      Changing atleast 1 pad every 2 hours clots noted. Bigger than quarter size color burgundy  3. ABDOMINAL PAIN SEVERITY: If present, ask: "How bad is it?"  (e.g., Scale 1-10; mild, moderate, or severe)   - MILD (1-3): doesn't interfere with normal activities, abdomen soft and not tender to touch    - MODERATE (4-7): interferes with normal activities or awakens from sleep, abdomen tender to touch    - SEVERE (8-10): excruciating pain, doubled over, unable to do any  normal activities     Constant low abdominal pain, cramping and sharp pain comes and goes  4. PREGNANCY: "Do you know how many weeks or months pregnant you are?" "When was the first day of your last normal menstrual period?"     Pregnancy test last week positive on 06/09/23 5. HEMODYNAMIC STATUS: "Are you weak or feeling lightheaded?" If Yes, ask: "Can you stand and walk normally?"      Na  6. OTHER SYMPTOMS: "What other symptoms are you having with the bleeding?" (e.g., passed tissue, vaginal discharge, fever, menstrual-type cramps)     Vaginal bleeding see above  Protocols used: Pregnancy - Vaginal Bleeding Less Than [redacted] Weeks EGA-A-AH

## 2023-06-16 NOTE — ED Notes (Signed)
Pt has filled up 7 pads today. Bad cramps since Monday lower abdomen and lower pelvic tearing (very sharp pain) today driving here around [id number]. This pain started off her menstrual cycle which finished June 4th. Missed July's period. Missed beginning of August's and now having spontaneous bleeding. Pregnancy test on August 8th, positive which makes her G3P1 (via c-section) 1A.

## 2023-06-16 NOTE — ED Provider Notes (Signed)
[place] [facility] AT [facility] Provider Note   CSN: [id number] Arrival date & time: 06/16/23  1657     History  Chief Complaint  Patient presents with   Vaginal Bleeding    Donna Potts is a 27 y.o. female G3P1 with no significant past medical history who presents to the emergency department complaining of vaginal bleeding starting yesterday.  Patient states her last menstrual period was June 4.  In July she had 1 day of very light bleeding, so she did not count this is a menstrual cycle.  She states that she has had to change her pad about 7 times today.  She is also having some cramping, that started a few days ago in her lower abdomen.  She intermittently has very sharp pain as well.  She denies any nausea, vomiting, urinary symptoms, vaginal discharge.  She states that she had a positive pregnancy test at home 1 week ago.   Vaginal Bleeding      Home Medications Prior to Admission medications   Medication Sig Start Date End Date Taking? Authorizing Provider  medroxyPROGESTERone (PROVERA) 10 MG tablet Take 2 tablets (20 mg total) by mouth 3 (three) times daily for 7 days. 06/16/23 06/23/23 Yes Azyah Flett T, PA-C  fluconazole (DIFLUCAN) 150 MG tablet Take 1 tablet (150 mg total) by mouth daily. Take in 3 days 12/31/19   Shanon Ace, PA-C  metroNIDAZOLE (FLAGYL) 500 MG tablet Take 1 tablet (500 mg total) by mouth 2 (two) times daily. 12/31/19   Walisiewicz, Yvonna Alanis E, PA-C  metroNIDAZOLE (METROGEL) 0.75 % vaginal gel Place 1 Applicatorful vaginally at bedtime. 12/26/19   [provider]      Allergies    Amoxicillin-pot clavulanate and Penicillins    Review of Systems   Review of Systems  Genitourinary:  Positive for pelvic pain and vaginal bleeding.  All other systems reviewed and are negative.   Physical Exam Updated Vital Signs BP 121/77   Pulse 76   Temp 98.3 F (36.8 C) (Oral)   Resp 15   Ht 4\' 10"  (1.473 m)   Wt  67.1 kg   LMP 05/04/2023   SpO2 100%   BMI 30.93 kg/m  Physical Exam Vitals and nursing note reviewed.  Constitutional:      Appearance: Normal appearance.  HENT:     Head: Normocephalic and atraumatic.  Eyes:     Conjunctiva/sclera: Conjunctivae normal.  Cardiovascular:     Rate and Rhythm: Normal rate and regular rhythm.  Pulmonary:     Effort: Pulmonary effort is normal. No respiratory distress.     Breath sounds: Normal breath sounds.  Abdominal:     General: There is no distension.     Palpations: Abdomen is soft.     Tenderness: There is no abdominal tenderness.  Skin:    General: Skin is warm and dry.  Neurological:     General: No focal deficit present.     Mental Status: She is alert.     ED Results / Procedures / Treatments   Labs (all labs ordered are listed, but only abnormal results are displayed) Labs Reviewed  PREGNANCY, URINE - Abnormal; Notable for the following components:      Result Value   Preg Test, Ur POSITIVE (*)    All other components within normal limits  URINALYSIS, ROUTINE W REFLEX MICROSCOPIC - Abnormal; Notable for the following components:   Color, Urine RED (*)    APPearance TURBID (*)  Glucose, UA   (*)    Value: TEST NOT REPORTED DUE TO COLOR INTERFERENCE OF URINE PIGMENT   Hgb urine dipstick   (*)    Value: TEST NOT REPORTED DUE TO COLOR INTERFERENCE OF URINE PIGMENT   Bilirubin Urine   (*)    Value: TEST NOT REPORTED DUE TO COLOR INTERFERENCE OF URINE PIGMENT   Ketones, ur   (*)    Value: TEST NOT REPORTED DUE TO COLOR INTERFERENCE OF URINE PIGMENT   Protein, ur   (*)    Value: TEST NOT REPORTED DUE TO COLOR INTERFERENCE OF URINE PIGMENT   Nitrite   (*)    Value: TEST NOT REPORTED DUE TO COLOR INTERFERENCE OF URINE PIGMENT   Leukocytes,Ua   (*)    Value: TEST NOT REPORTED DUE TO COLOR INTERFERENCE OF URINE PIGMENT   All other components within normal limits  HCG, QUANTITATIVE, PREGNANCY - Abnormal; Notable for the  following components:   hCG, Beta Chain, Quant, S 49 (*)    All other components within normal limits  URINALYSIS, MICROSCOPIC (REFLEX) - Abnormal; Notable for the following components:   Bacteria, UA RARE (*)    All other components within normal limits  CBC WITH DIFFERENTIAL/PLATELET  COMPREHENSIVE METABOLIC PANEL  ABO/RH    EKG None  Radiology US OB LESS THAN 14 WEEKS WITH OB TRANSVAGINAL  Result Date: 06/16/2023 CLINICAL DATA:  Vaginal bleeding, pregnancy. EXAM: OBSTETRIC <14 WK [place] AND TRANSVAGINAL OB US TECHNIQUE: Both transabdominal and transvaginal ultrasound examinations were performed for complete evaluation of the gestation as well as the maternal uterus, adnexal regions, and pelvic cul-de-sac. Transvaginal technique was performed to assess early pregnancy. COMPARISON:  12/31/2019. FINDINGS: Intrauterine gestational sac: None Yolk sac:  No Embryo:  No Cardiac Activity: No Heart Rate: None Maternal uterus/adnexae: The endometrium is 6 mm in thickness. The ovaries are within normal limits. No free fluid in the pelvis. IMPRESSION: No evidence of intrauterine pregnancy. Electronically Signed   By: Thornell Sartorius M.D.   On: 06/16/2023 21:57    Procedures Procedures    Medications Ordered in ED Medications - No data to display  ED Course/ Medical Decision Making/ A&P                                 Medical Decision Making Amount and/or Complexity of Data Reviewed Labs: ordered. Radiology: ordered.  Risk Prescription drug management.   This patient is a 27 y.o. female  who presents to the ED for concern of vaginal bleeding.   Differential diagnoses prior to evaluation: The emergent differential diagnosis includes, but is not limited to,  Abnormal uterine bleeding, threatened miscarriage, incomplete miscarriage, normal bleeding from an early trimester pregnancy, ectopic pregnancy, vaginal/cervical trauma, subchorionic hemorrhage/hematoma. This is not an exhaustive  differential.   Past Medical History / Co-morbidities / Social History: G3P1  Physical Exam: Physical exam performed. The pertinent findings include: Normal vital signs, no acute distress.  Abdomen soft and nontender.    Lab Tests/Imaging studies: I personally interpreted labs/imaging and the pertinent results include: CBC and CMP normal.  Urine pregnancy positive, hCG quantitative 49.  Rh+.  Urinalysis with significant RBCs, negative for infection.  Transvaginal ultrasound shows no evidence of intrauterine pregnancy.  No ectopic pregnancy.. I agree with the radiologist interpretation.  Disposition: After consideration of the diagnostic results and the patients response to treatment, I feel that emergency department workup does not suggest an  emergent condition requiring admission or immediate intervention beyond what has been performed at this time. The plan is: Discharge to home with OB/GYN follow-up.  Patient requesting medication to help slow the bleeding, will give medroxyprogesterone.  She is hemodynamically stable within normal hemoglobin.  Not requiring further emergent intervention.  The patient is safe for discharge and has been instructed to return immediately for worsening symptoms, change in symptoms or any other concerns.  Final Clinical Impression(s) / ED Diagnoses Final diagnoses:  Miscarriage  Vaginal bleeding    Rx / DC Orders ED Discharge Orders          Ordered    medroxyPROGESTERone (PROVERA) 10 MG tablet  3 times daily        06/16/23 2220           Portions of this report may have been transcribed using voice recognition software. Every effort was made to ensure accuracy; however, inadvertent computerized transcription errors may be present.    Jeanella Flattery 06/16/23 2224    Vanetta Mulders, MD 06/16/23 2340
